# Patient Record
Sex: Male | Born: 1955 | Race: Black or African American | Hispanic: No | Marital: Single | Smoking: Current every day smoker
Health system: Southern US, Community
[De-identification: ages and names within clinical notes are randomized; demographics above are authoritative.]

## PROBLEM LIST (undated history)

## (undated) ENCOUNTER — Emergency Department (HOSPITAL_COMMUNITY): Payer: 59 | Source: Home / Self Care

## (undated) DIAGNOSIS — J45909 Unspecified asthma, uncomplicated: Secondary | ICD-10-CM

## (undated) DIAGNOSIS — M549 Dorsalgia, unspecified: Secondary | ICD-10-CM

## (undated) DIAGNOSIS — E119 Type 2 diabetes mellitus without complications: Secondary | ICD-10-CM

## (undated) DIAGNOSIS — I10 Essential (primary) hypertension: Secondary | ICD-10-CM

## (undated) HISTORY — PX: CYST EXCISION: SHX5701

## (undated) HISTORY — PX: NO PAST SURGERIES: SHX2092

---

## 1998-02-24 ENCOUNTER — Emergency Department (HOSPITAL_COMMUNITY): Admission: EM | Admit: 1998-02-24 | Discharge: 1998-02-24 | Payer: Self-pay | Admitting: Emergency Medicine

## 2008-02-28 ENCOUNTER — Emergency Department (HOSPITAL_COMMUNITY): Admission: EM | Admit: 2008-02-28 | Discharge: 2008-02-29 | Payer: Self-pay | Admitting: Emergency Medicine

## 2012-11-20 ENCOUNTER — Encounter (HOSPITAL_COMMUNITY): Payer: Self-pay | Admitting: Emergency Medicine

## 2012-11-20 ENCOUNTER — Emergency Department (HOSPITAL_COMMUNITY)
Admission: EM | Admit: 2012-11-20 | Discharge: 2012-11-21 | Disposition: A | Discharge status: Home / Self Care | Payer: 59 | Attending: Emergency Medicine | Admitting: Emergency Medicine

## 2012-11-20 ENCOUNTER — Emergency Department (HOSPITAL_COMMUNITY): Payer: 59

## 2012-11-20 DIAGNOSIS — R05 Cough: Secondary | ICD-10-CM | POA: Insufficient documentation

## 2012-11-20 DIAGNOSIS — J111 Influenza due to unidentified influenza virus with other respiratory manifestations: Secondary | ICD-10-CM | POA: Insufficient documentation

## 2012-11-20 DIAGNOSIS — J3489 Other specified disorders of nose and nasal sinuses: Secondary | ICD-10-CM | POA: Insufficient documentation

## 2012-11-20 DIAGNOSIS — R509 Fever, unspecified: Secondary | ICD-10-CM | POA: Insufficient documentation

## 2012-11-20 DIAGNOSIS — R5381 Other malaise: Secondary | ICD-10-CM | POA: Insufficient documentation

## 2012-11-20 DIAGNOSIS — R630 Anorexia: Secondary | ICD-10-CM | POA: Insufficient documentation

## 2012-11-20 DIAGNOSIS — F17200 Nicotine dependence, unspecified, uncomplicated: Secondary | ICD-10-CM | POA: Insufficient documentation

## 2012-11-20 DIAGNOSIS — R059 Cough, unspecified: Secondary | ICD-10-CM | POA: Insufficient documentation

## 2012-11-20 DIAGNOSIS — J029 Acute pharyngitis, unspecified: Secondary | ICD-10-CM | POA: Insufficient documentation

## 2012-11-20 MED ORDER — ACETAMINOPHEN 325 MG PO TABS
650.0000 mg | ORAL_TABLET | Freq: Once | ORAL | Status: AC
  Administered 2012-11-20: 650 mg via ORAL
  Filled 2012-11-20: qty 2

## 2012-11-20 NOTE — ED Notes (Signed)
Pt c/o burning with inspiration, cough, nonproductive x 3 days. +body aches, diarrhea, sore throat, headache, nasal congestion

## 2012-11-21 LAB — INFLUENZA PANEL BY PCR (TYPE A & B)
H1N1 flu by pcr: DETECTED — AB
Influenza B By PCR: NEGATIVE

## 2012-11-21 MED ORDER — HYDROCOD POLST-CHLORPHEN POLST 10-8 MG/5ML PO LQCR: 5.0000 mL | Freq: Two times a day (BID) | ORAL | Status: DC | PRN

## 2012-11-21 MED ORDER — ALBUTEROL SULFATE HFA 108 (90 BASE) MCG/ACT IN AERS: 1.0000 | INHALATION_SPRAY | Freq: Four times a day (QID) | RESPIRATORY_TRACT | Status: DC | PRN

## 2012-11-21 NOTE — ED Provider Notes (Signed)
History     CSN: [id number]  Arrival date & time 11/20/12  2048   First MD Initiated Contact with Patient 11/20/12 2356      Chief Complaint  Patient presents with  . Shortness of Breath    (Consider location/radiation/quality/duration/timing/severity/associated sxs/prior treatment) HPI Comments: Pt presents to the ED with complaints of flu-like symptoms of cough, congestion, sore throat, muscle aches, chills, fever,  headache, decreased appetite, and fatigue. The patient states that the symptoms started three days ago.  Pt has been around other sick contacts and did not get the flu shot this year. The patient denies  neck pain/stiffness, weakness, vision changes, severe abdominal pain, vomiting, diarrhea, inability to eat or drink, difficulty breathing, SOB, wheezing, orchest pain. The patient has taken Nyquil for his symptoms with mild relief.   He is otherwise healthy.  Not immunocompromised.    The history is provided by the patient.    History reviewed. No pertinent past medical history.  Past Surgical History  Procedure Date  . Cyst excision     No family history on file.  History  Substance Use Topics  . Smoking status: Current Every Day Smoker  . Smokeless tobacco: Not on file  . Alcohol Use: Yes     Comment: occasional      Review of Systems  Constitutional: Positive for fever, chills, appetite change and fatigue.  HENT: Positive for congestion, sore throat and rhinorrhea. Negative for drooling, trouble swallowing, neck pain, neck stiffness and sinus pressure.   Eyes: Negative for visual disturbance.  Respiratory: Positive for cough. Negative for shortness of breath.   Cardiovascular: Negative for chest pain.  Gastrointestinal: Negative for nausea, vomiting, abdominal pain and diarrhea.  Skin: Negative for rash.  Neurological: Positive for headaches. Negative for dizziness, syncope and light-headedness.  Psychiatric/Behavioral: Negative for confusion.     Allergies  Review of patient's allergies indicates no known allergies.  Home Medications   Current Outpatient Rx  Name  Route  Sig  Dispense  Refill  . OXYCODONE HCL ER 20 MG PO TB12   Oral   Take 20 mg by mouth 3 (three) times daily.         Marland Kitchen PSEUDOEPH-DOXYLAMINE-DM-APAP 60-7.04-23-999 MG/30ML PO LIQD   Oral   Take 20 mLs by mouth every 6 (six) hours as needed. Cold symptons           BP 127/79  Pulse 101  Temp 99.8 F (37.7 C) (Oral)  Resp 22  Ht 5\' 11"  (1.803 m)  Wt 220 lb (99.791 kg)  BMI 30.68 kg/m2  SpO2 96%  Physical Exam  Nursing note and vitals reviewed. Constitutional: He appears well-developed and well-nourished. No distress.  HENT:  Head: Normocephalic and atraumatic.  Right Ear: Tympanic membrane and ear canal normal.  Left Ear: Tympanic membrane and ear canal normal.  Nose: Rhinorrhea present. Right sinus exhibits no maxillary sinus tenderness and no frontal sinus tenderness. Left sinus exhibits no maxillary sinus tenderness and no frontal sinus tenderness.  Mouth/Throat: Oropharynx is clear and moist.  Neck: Normal range of motion. Neck supple.  Cardiovascular: Normal rate, regular rhythm and normal heart sounds.   Pulmonary/Chest: Effort normal and breath sounds normal. No respiratory distress. He has no wheezes. He has no rales. He exhibits no tenderness.  Abdominal: Soft. There is no tenderness.  Musculoskeletal: Normal range of motion.  Neurological: He is alert.  Skin: Skin is warm and dry. No rash noted. He is not diaphoretic.  Psychiatric: He has a  normal mood and affect.    ED Course  Procedures (including critical care time)  Labs Reviewed - No data to display Dg Chest 2 View  11/20/2012  *RADIOLOGY REPORT*  Clinical Data: Shortness of breath.  Cough and fever  CHEST - 2 VIEW  Comparison: 07/16/2007  Findings: Normal heart size.  No pleural effusion or edema. Atelectasis is noted within the lung bases bilaterally.  No focal areas  of airspace consolidation.  The visualized osseous structures are unremarkable.  IMPRESSION:  1.  Linear atelectasis noted within both lung bases.   Original Report Authenticated By: Signa Kell, M.D.      1. Influenza       MDM  Patient with symptoms consistent with influenza.  Vitals are stable.  Fever responded to Tylenol.    No signs of dehydration, tolerating PO's.  Lungs are clear. Negative CXR.   Discussed the cost versus benefit of Tamiflu treatment with the patient.  The patient understands that symptoms are greater than the recommended 24-48 hour window of treatment.  Patient will be discharged with instructions to orally hydrate, rest, and use over-the-counter medications such as anti-inflammatories ibuprofen and Aleve for muscle aches and Tylenol for fever.  Patient will also be given a cough suppressant.         Pascal Lux Festus, PA-C 11/22/12 2008

## 2012-11-22 NOTE — ED Provider Notes (Signed)
Medical screening examination/treatment/procedure(s) were performed by non-physician practitioner and as supervising physician I was immediately available for consultation/collaboration.  Kadance Mccuistion, MD 11/22/12 2304 

## 2014-05-17 ENCOUNTER — Other Ambulatory Visit: Payer: Self-pay | Admitting: Family

## 2014-05-17 ENCOUNTER — Ambulatory Visit
Admission: RE | Admit: 2014-05-17 | Discharge: 2014-05-17 | Disposition: A | Discharge status: Home / Self Care | Payer: Worker's Compensation | Source: Ambulatory Visit | Attending: Family | Admitting: Family

## 2014-05-17 DIAGNOSIS — R0602 Shortness of breath: Secondary | ICD-10-CM

## 2014-05-17 DIAGNOSIS — R0789 Other chest pain: Secondary | ICD-10-CM

## 2014-05-17 DIAGNOSIS — F17200 Nicotine dependence, unspecified, uncomplicated: Secondary | ICD-10-CM

## 2015-01-20 ENCOUNTER — Encounter (HOSPITAL_COMMUNITY): Payer: Self-pay

## 2015-01-20 ENCOUNTER — Emergency Department (HOSPITAL_COMMUNITY)
Admission: EM | Admit: 2015-01-20 | Discharge: 2015-01-20 | Disposition: A | Discharge status: Home / Self Care | Payer: 59 | Source: Home / Self Care | Attending: Emergency Medicine | Admitting: Emergency Medicine

## 2015-01-20 DIAGNOSIS — R109 Unspecified abdominal pain: Secondary | ICD-10-CM

## 2015-01-20 HISTORY — DX: Dorsalgia, unspecified: M54.9

## 2015-01-20 LAB — POCT URINALYSIS DIP (DEVICE)
Bilirubin Urine: NEGATIVE
Glucose, UA: NEGATIVE mg/dL
Hgb urine dipstick: NEGATIVE
KETONES UR: NEGATIVE mg/dL
LEUKOCYTES UA: NEGATIVE
Nitrite: NEGATIVE
PH: 6 (ref 5.0–8.0)
PROTEIN: NEGATIVE mg/dL
UROBILINOGEN UA: 0.2 mg/dL (ref 0.0–1.0)

## 2015-01-20 MED ORDER — KETOROLAC TROMETHAMINE 60 MG/2ML IM SOLN
60.0000 mg | Freq: Once | INTRAMUSCULAR | Status: AC
  Administered 2015-01-20: 60 mg via INTRAMUSCULAR

## 2015-01-20 MED ORDER — KETOROLAC TROMETHAMINE 60 MG/2ML IM SOLN
INTRAMUSCULAR | Status: AC
  Filled 2015-01-20: qty 2

## 2015-01-20 MED ORDER — IBUPROFEN 800 MG PO TABS: 800.0000 mg | ORAL_TABLET | Freq: Three times a day (TID) | ORAL | Status: DC | PRN

## 2015-01-20 MED ORDER — TAMSULOSIN HCL 0.4 MG PO CAPS: 0.4000 mg | ORAL_CAPSULE | Freq: Every day | ORAL | Status: DC

## 2015-01-20 MED ORDER — CYCLOBENZAPRINE HCL 10 MG PO TABS: 10.0000 mg | ORAL_TABLET | Freq: Three times a day (TID) | ORAL | Status: DC | PRN

## 2015-01-20 NOTE — ED Notes (Signed)
C/o pain in right flank since yesterday, worse today, denies injury . Using oxycontin for pain ( from back issues) . Denies blood in urine . NAD

## 2015-01-20 NOTE — ED Provider Notes (Addendum)
CSN: [id number]     Arrival date & time 01/20/15  1020 History   First MD Initiated Contact with Patient 01/20/15 1106     Chief Complaint  Patient presents with  . Back Pain   (Consider location/radiation/quality/duration/timing/severity/associated sxs/prior Treatment) HPI He is a 59 year old man here for evaluation of right flank pain. He states this started last night. It comes and goes. He denies any triggering events. He states when it is there it is sharp and it is difficult for him to sit still. The pain goes from the mid back to the side. No radiating pain. He denies any gross hematuria. No fevers or chills. No injuries or new activities.  He takes OxyContin for chronic back pain, this has not been helping with the flank pain.  He gets this medication through Dr. Vear Clock at pain management, where he has been seen for the last 6-7 years.  Past Medical History  Diagnosis Date  . Back pain    Past Surgical History  Procedure Laterality Date  . Cyst excision     History reviewed. No pertinent family history. History  Substance Use Topics  . Smoking status: Current Every Day Smoker  . Smokeless tobacco: Not on file  . Alcohol Use: Yes     Comment: occasional    Review of Systems  Constitutional: Negative for fever and chills.  Gastrointestinal: Negative for nausea and vomiting.  Genitourinary: Positive for flank pain. Negative for dysuria and hematuria.  Musculoskeletal: Positive for back pain.    Allergies  Review of patient's allergies indicates no known allergies.  Home Medications   Prior to Admission medications   Medication Sig Start Date End Date Taking? Authorizing Provider  oxyCODONE (OXYCONTIN) 20 MG 12 hr tablet Take 20 mg by mouth 3 (three) times daily.   Yes Historical Provider, MD  albuterol (PROVENTIL HFA;VENTOLIN HFA) 108 (90 BASE) MCG/ACT inhaler Inhale 1-2 puffs into the lungs every 6 (six) hours as needed for wheezing. 11/21/12   Santiago Glad, PA-C   chlorpheniramine-HYDROcodone (TUSSIONEX PENNKINETIC ER) 10-8 MG/5ML LQCR Take 5 mLs by mouth every 12 (twelve) hours as needed. 11/21/12   Heather Laisure, PA-C  cyclobenzaprine (FLEXERIL) 10 MG tablet Take 1 tablet (10 mg total) by mouth 3 (three) times daily as needed for muscle spasms. 01/20/15   Charm Rings, MD  ibuprofen (ADVIL,MOTRIN) 800 MG tablet Take 1 tablet (800 mg total) by mouth every 8 (eight) hours as needed for moderate pain. 01/20/15   Charm Rings, MD  Pseudoeph-Doxylamine-DM-APAP (NYQUIL) 60-7.04-23-999 MG/30ML LIQD Take 20 mLs by mouth every 6 (six) hours as needed. Cold symptons    Historical Provider, MD  tamsulosin (FLOMAX) 0.4 MG CAPS capsule Take 1 capsule (0.4 mg total) by mouth daily. 01/20/15   Charm Rings, MD   BP 129/73 mmHg  Pulse 62  Temp(Src) 96.9 F (36.1 C) (Oral)  Resp 18  SpO2 98% Physical Exam  Constitutional: He is oriented to person, place, and time. He appears well-developed and well-nourished. He appears distressed (shifting around a lot).  Neck: Neck supple.  Cardiovascular: Normal rate.   Pulmonary/Chest: Effort normal.  Musculoskeletal:  Back: No erythema or edema. He does have some mild right thoracic paraspinous spasm. Positive right CVA tenderness. No point tenderness.  Neurological: He is alert and oriented to person, place, and time.    ED Course  Procedures (including critical care time) Labs Review Labs Reviewed  POCT URINALYSIS DIP (DEVICE)    Imaging Review No results found.  MDM   1. Right flank pain    Toradol 60 mg IM given.  History and exam are concerning for kidney stone, however his UA is completely normal. He does also have some muscle spasm in the area of pain. He will continue his OxyContin as per his pain management doctor. I've added ibuprofen 800 mg 3 times a day. Also Flexeril 3 times a day as needed. I also prescribed Flomax for the next 2 weeks just in case there is a stone. Reviewed warning signs to  go to the emergency room.    Charm RingsErin J Serinity Ware, MD 01/20/15 [id number]  Charm RingsErin J Terie Lear, MD 01/20/15 1212

## 2015-01-20 NOTE — Discharge Instructions (Signed)
Your pain may be related to muscle spasm or possibly a kidney stone. Continue your OxyContin as prescribed by your pain management doctor. Take ibuprofen 800 mg 3 times a day as needed. Use the Flomax daily for 2 weeks. If there is a stone, this will help it pass. Use Flexeril 3 times a day as needed for muscle spasm. If your symptoms change or worsen, please go to the emergency room.

## 2018-01-21 ENCOUNTER — Encounter (HOSPITAL_COMMUNITY): Payer: Self-pay | Admitting: Emergency Medicine

## 2018-01-21 ENCOUNTER — Other Ambulatory Visit: Payer: Self-pay

## 2018-01-21 ENCOUNTER — Emergency Department (HOSPITAL_COMMUNITY): Payer: 59

## 2018-01-21 ENCOUNTER — Observation Stay (HOSPITAL_COMMUNITY)
Admission: EM | Admit: 2018-01-21 | Discharge: 2018-01-22 | Disposition: A | Discharge status: Home / Self Care | Payer: 59 | Attending: Internal Medicine | Admitting: Internal Medicine

## 2018-01-21 ENCOUNTER — Observation Stay (HOSPITAL_BASED_OUTPATIENT_CLINIC_OR_DEPARTMENT_OTHER): Payer: 59

## 2018-01-21 DIAGNOSIS — Z79899 Other long term (current) drug therapy: Secondary | ICD-10-CM | POA: Diagnosis not present

## 2018-01-21 DIAGNOSIS — M47812 Spondylosis without myelopathy or radiculopathy, cervical region: Secondary | ICD-10-CM | POA: Insufficient documentation

## 2018-01-21 DIAGNOSIS — M6281 Muscle weakness (generalized): Secondary | ICD-10-CM | POA: Insufficient documentation

## 2018-01-21 DIAGNOSIS — M4050 Lordosis, unspecified, site unspecified: Secondary | ICD-10-CM | POA: Diagnosis not present

## 2018-01-21 DIAGNOSIS — W19XXXA Unspecified fall, initial encounter: Secondary | ICD-10-CM

## 2018-01-21 DIAGNOSIS — M549 Dorsalgia, unspecified: Secondary | ICD-10-CM | POA: Diagnosis not present

## 2018-01-21 DIAGNOSIS — J45909 Unspecified asthma, uncomplicated: Secondary | ICD-10-CM

## 2018-01-21 DIAGNOSIS — R55 Syncope and collapse: Secondary | ICD-10-CM | POA: Diagnosis not present

## 2018-01-21 DIAGNOSIS — M4802 Spinal stenosis, cervical region: Secondary | ICD-10-CM | POA: Insufficient documentation

## 2018-01-21 DIAGNOSIS — Z7984 Long term (current) use of oral hypoglycemic drugs: Secondary | ICD-10-CM | POA: Insufficient documentation

## 2018-01-21 DIAGNOSIS — F17210 Nicotine dependence, cigarettes, uncomplicated: Secondary | ICD-10-CM | POA: Insufficient documentation

## 2018-01-21 DIAGNOSIS — I10 Essential (primary) hypertension: Secondary | ICD-10-CM | POA: Insufficient documentation

## 2018-01-21 DIAGNOSIS — G8929 Other chronic pain: Secondary | ICD-10-CM

## 2018-01-21 DIAGNOSIS — E119 Type 2 diabetes mellitus without complications: Secondary | ICD-10-CM

## 2018-01-21 HISTORY — DX: Essential (primary) hypertension: I10

## 2018-01-21 HISTORY — DX: Type 2 diabetes mellitus without complications: E11.9

## 2018-01-21 HISTORY — DX: Unspecified asthma, uncomplicated: J45.909

## 2018-01-21 LAB — ECHOCARDIOGRAM COMPLETE

## 2018-01-21 LAB — COMPREHENSIVE METABOLIC PANEL
ALT: 49 U/L (ref 17–63)
ANION GAP: 11 (ref 5–15)
AST: 42 U/L — ABNORMAL HIGH (ref 15–41)
Albumin: 3.8 g/dL (ref 3.5–5.0)
Alkaline Phosphatase: 45 U/L (ref 38–126)
BILIRUBIN TOTAL: 0.7 mg/dL (ref 0.3–1.2)
BUN: 7 mg/dL (ref 6–20)
CALCIUM: 9.1 mg/dL (ref 8.9–10.3)
CO2: 21 mmol/L — ABNORMAL LOW (ref 22–32)
Chloride: 107 mmol/L (ref 101–111)
Creatinine, Ser: 0.95 mg/dL (ref 0.61–1.24)
GFR calc Af Amer: >60 mL/min (ref >60)
Glucose, Bld: 119 mg/dL — ABNORMAL HIGH (ref 65–99)
POTASSIUM: 4.2 mmol/L (ref 3.5–5.1)
Sodium: 139 mmol/L (ref 135–145)
TOTAL PROTEIN: 6.6 g/dL (ref 6.5–8.1)

## 2018-01-21 LAB — HEMOGLOBIN A1C
Hgb A1c MFr Bld: 6.5 % — ABNORMAL HIGH (ref 4.8–5.6)
MEAN PLASMA GLUCOSE: 139.85 mg/dL

## 2018-01-21 LAB — CBC WITH DIFFERENTIAL/PLATELET
BASOS ABS: 0 10*3/uL (ref 0.0–0.1)
BASOS PCT: 0 %
EOS ABS: 0 10*3/uL (ref 0.0–0.7)
Eosinophils Relative: 0 %
HCT: 42.5 % (ref 39.0–52.0)
HEMOGLOBIN: 14 g/dL (ref 13.0–17.0)
Lymphocytes Relative: 23 %
Lymphs Abs: 1.7 10*3/uL (ref 0.7–4.0)
MCH: 30.3 pg (ref 26.0–34.0)
MCHC: 32.9 g/dL (ref 30.0–36.0)
MCV: 92 fL (ref 78.0–100.0)
MONO ABS: 0.4 10*3/uL (ref 0.1–1.0)
Monocytes Relative: 6 %
NEUTROS ABS: 5 10*3/uL (ref 1.7–7.7)
NEUTROS PCT: 71 %
PLATELETS: 263 10*3/uL (ref 150–400)
RBC: 4.62 MIL/uL (ref 4.22–5.81)
RDW: 13.9 % (ref 11.5–15.5)
WBC: 7.1 10*3/uL (ref 4.0–10.5)

## 2018-01-21 LAB — CBG MONITORING, ED: Glucose-Capillary: 89 mg/dL (ref 65–99)

## 2018-01-21 LAB — TROPONIN I: Troponin I: <0.03 ng/mL (ref <0.03)

## 2018-01-21 LAB — TSH: TSH: 0.751 u[IU]/mL (ref 0.350–4.500)

## 2018-01-21 LAB — GLUCOSE, CAPILLARY: GLUCOSE-CAPILLARY: 123 mg/dL — AB (ref 65–99)

## 2018-01-21 MED ORDER — OXYCODONE HCL 5 MG PO TABS
5.0000 mg | ORAL_TABLET | Freq: Four times a day (QID) | ORAL | Status: DC | PRN
  Administered 2018-01-21: 10 mg via ORAL
  Filled 2018-01-21: qty 2

## 2018-01-21 MED ORDER — ACETAMINOPHEN 325 MG PO TABS: 650.0000 mg | ORAL_TABLET | Freq: Four times a day (QID) | ORAL | Status: DC | PRN

## 2018-01-21 MED ORDER — POLYETHYLENE GLYCOL 3350 17 G PO PACK: 17.0000 g | PACK | Freq: Every day | ORAL | Status: DC | PRN

## 2018-01-21 MED ORDER — ALBUTEROL SULFATE (2.5 MG/3ML) 0.083% IN NEBU: 2.5000 mg | INHALATION_SOLUTION | Freq: Four times a day (QID) | RESPIRATORY_TRACT | Status: DC | PRN

## 2018-01-21 MED ORDER — ENOXAPARIN SODIUM 40 MG/0.4ML ~~LOC~~ SOLN
40.0000 mg | SUBCUTANEOUS | Status: DC
  Administered 2018-01-21: 40 mg via SUBCUTANEOUS
  Filled 2018-01-21: qty 0.4

## 2018-01-21 MED ORDER — INSULIN ASPART 100 UNIT/ML ~~LOC~~ SOLN: 0.0000 [IU] | Freq: Three times a day (TID) | SUBCUTANEOUS | Status: DC

## 2018-01-21 MED ORDER — SODIUM CHLORIDE 0.9 % IV SOLN
INTRAVENOUS | Status: DC
  Administered 2018-01-21 – 2018-01-22 (×3): via INTRAVENOUS

## 2018-01-21 MED ORDER — ACETAMINOPHEN 650 MG RE SUPP: 650.0000 mg | Freq: Four times a day (QID) | RECTAL | Status: DC | PRN

## 2018-01-21 MED ORDER — MORPHINE SULFATE (PF) 4 MG/ML IV SOLN
8.0000 mg | Freq: Once | INTRAVENOUS | Status: AC
  Administered 2018-01-21: 8 mg via INTRAVENOUS
  Filled 2018-01-21: qty 2

## 2018-01-21 MED ORDER — ADULT MULTIVITAMIN W/MINERALS CH
1.0000 | ORAL_TABLET | Freq: Every day | ORAL | Status: DC
  Administered 2018-01-21 – 2018-01-22 (×2): 1 via ORAL
  Filled 2018-01-21 (×2): qty 1

## 2018-01-21 MED ORDER — MORPHINE SULFATE (PF) 4 MG/ML IV SOLN
1.0000 mg | INTRAVENOUS | Status: DC | PRN
  Administered 2018-01-21 – 2018-01-22 (×2): 1 mg via INTRAVENOUS
  Filled 2018-01-21 (×2): qty 1

## 2018-01-21 MED ORDER — TRAMADOL HCL 50 MG PO TABS: 50.0000 mg | ORAL_TABLET | Freq: Four times a day (QID) | ORAL | Status: DC | PRN

## 2018-01-21 MED ORDER — MORPHINE SULFATE ER 15 MG PO TBCR
15.0000 mg | EXTENDED_RELEASE_TABLET | Freq: Two times a day (BID) | ORAL | Status: DC
  Administered 2018-01-21 – 2018-01-22 (×2): 15 mg via ORAL
  Filled 2018-01-21 (×2): qty 1

## 2018-01-21 NOTE — ED Notes (Signed)
Dinner tray ordered.

## 2018-01-21 NOTE — ED Triage Notes (Addendum)
Pt arrives from home via [facility] reporting dizziness and LOC with fall, lac noted to R eyelid with swelling and abrasion to R shoulder.  Pt reporting pins and needles sensation in BUE to elbow, grip strength weak bilat, c/o "tightness" around Chest and back since fall. Pt deneis CP, SOB, before fall.  Pt able to move all extremities, pulses intact.  EMS reports giving 50 mcg of fentanyl x 2, 324 ASA.  Pt AOx4, c-collar on and aligned. Dr. Jeraldine LootsLockwood at bedside.

## 2018-01-21 NOTE — Progress Notes (Signed)
  Echocardiogram 2D Echocardiogram has been performed.  Roosvelt MaserLane, Shonita Rinck F 01/21/2018, 4:20 PM

## 2018-01-21 NOTE — H&P (Signed)
History and Physical    Ryan Benson:[id number] DOB: [birth year] DOA: 01/21/2018   PCP: Patient, No Pcp Per   Patient coming from:  Home    Chief Complaint: Syncope   HPI: Ryan Benson is a 62 y.o. 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  The patient stood up to reach for a glass of juice, when he experienced a syncopal episode, hitting his head on the right side, without any prodromal symptoms.  He denies any history of seizures.  He denies any prior history of syncope, presyncope, or recent falls.  He denies any chest pain, or palpitations.  He denies any prior cardiac history, or having seen a cardiologist.  He denies any shortness of breath or cough.  No recent infections.  He denies any urinary or bowel incontinence.  He denies any unilateral weakness.  He denies any vision changes, or confusion.  He was in significant amount of pain upon arrival.  He denies any tobacco, alcohol, or recreational drug use.  No new herbal supplements.  No recent long distance trips.   ED Course:  BP (!) 145/80   Pulse 65   Temp 98.2 F (36.8 C) (Oral)   Resp 12   SpO2 99%   On presentation, the patient did receive generous pain control, IV fluids as well as treatment to the right eyebrow, the area of laceration after the fall.  No confusion was noted on presentation. Glucose 119 Creatinine 0.95. AST 42, ALT 49.  3 bilirubin 0.7. CBC is normal. CT of the C-spine, CT of the head are negative for acute intracranial abnormalities or fractures. Right shoulder x-ray, is negative for fractures. MRI of the cervical spine is negative as well. EKG sinus rhythm, is negative for acute disease.  Review of Systems:  As per HPI otherwise all other systems reviewed and are  negative  Past Medical History:  Diagnosis Date  . Asthma   . Back pain   . Diabetes mellitus without complication [facility])     Past Surgical History:  Procedure Laterality Date  . CYST EXCISION      Social History Social History   Socioeconomic History  . Marital status: Single    Spouse name: Not on file  . Number of children: Not on file  . Years of education: Not on file  . Highest education level: Not on file  Social Needs  . Financial resource strain: Not on file  . Food insecurity - worry: Not on file  . Food insecurity - inability: Not on file  . Transportation needs - medical: Not on file  . Transportation needs - non-medical: Not on file  Occupational History  . Not on file  Tobacco Use  . Smoking status: Current Every Day Smoker    Packs/day: 0.50  Substance and Sexual Activity  . Alcohol use: Yes    Alcohol/week: 8.4 oz    Types: 14 Glasses of wine per week    Comment: daily  . Drug use: No  . Sexual activity: Not on file  Other Topics Concern  . Not on file  Social History Narrative  . Not on file     No Known Allergies  No family history on file.    Prior to Admission medications   Medication Sig Start  Date End Date Taking? Authorizing Provider  metFORMIN (GLUCOPHAGE) 500 MG tablet Take 500 mg by mouth 2 (two) times daily. 01/08/18  Yes [provider]  morphine (MS CONTIN) 30 MG 12 hr tablet Take 30 mg by mouth 2 (two) times daily. 12/30/17  Yes [provider]  Multiple Vitamin (MULTIVITAMIN WITH MINERALS) TABS tablet Take 1 tablet by mouth daily.   Yes [provider]  Oxycodone HCl 10 MG TABS Take 10 mg by mouth every 6 (six) hours as needed for pain. 01/15/18  Yes [provider]  albuterol (PROVENTIL HFA;VENTOLIN HFA) 108 (90 BASE) MCG/ACT inhaler Inhale 1-2 puffs into the lungs every 6 (six) hours as needed for wheezing. Patient not taking: Reported on 01/21/2018 11/21/12   Santiago Glad, PA-C   chlorpheniramine-HYDROcodone (TUSSIONEX PENNKINETIC ER) 10-8 MG/5ML LQCR Take 5 mLs by mouth every 12 (twelve) hours as needed. Patient not taking: Reported on 01/21/2018 11/21/12   Santiago Glad, PA-C  cyclobenzaprine (FLEXERIL) 10 MG tablet Take 1 tablet (10 mg total) by mouth 3 (three) times daily as needed for muscle spasms. Patient not taking: Reported on 01/21/2018 01/20/15   Charm Rings, MD  ibuprofen (ADVIL,MOTRIN) 800 MG tablet Take 1 tablet (800 mg total) by mouth every 8 (eight) hours as needed for moderate pain. Patient not taking: Reported on 01/21/2018 01/20/15   Charm Rings, MD  tamsulosin (FLOMAX) 0.4 MG CAPS capsule Take 1 capsule (0.4 mg total) by mouth daily. Patient not taking: Reported on 01/21/2018 01/20/15   Charm Rings, MD    Physical Exam:  Vitals:   01/21/18 1115 01/21/18 1145 01/21/18 1200 01/21/18 1215  BP: (!) 148/85 (!) 145/86 (!) 133/92 (!) 145/80  Pulse: 63 63 74 65  Resp:   17 12  Temp:      TempSrc:      SpO2: 100% 99% 99% 99%   Constitutional: NAD, calm, comfortable Eyes: PERRL, R lids with edema and bruising with laceration over the R eyebrow, and conjunctivae normal ENMT: Mucous membranes are moist, without exudate or lesions  Neck: normal, supple, no masses, no thyromegaly Respiratory: clear to auscultation bilaterally, no wheezing, no crackles. Normal respiratory effort  Cardiovascular: Regular rate and rhythm,  murmur, rubs or gallops. No extremity edema. 2+ pedal pulses. No carotid bruits.  Abdomen: Soft, non tender, No hepatosplenomegaly. Bowel sounds positive.  Musculoskeletal: no clubbing / cyanosis. Moves all extremities Skin: no jaundice, No lesion other than above  Neurologic: Sensation intact  Strength equal in all extremities Psychiatric:   Alert and oriented x 3. Normal mood.     Labs on Admission: I have personally reviewed following labs and imaging studies  CBC: Recent Labs  Lab 01/21/18 0854  WBC 7.1  NEUTROABS 5.0   HGB 14.0  HCT 42.5  MCV 92.0  PLT 263    Basic Metabolic Panel: Recent Labs  Lab 01/21/18 0854  NA 139  K 4.2  CL 107  CO2 21*  GLUCOSE 119*  BUN 7  CREATININE 0.95  CALCIUM 9.1    GFR: CrCl cannot be calculated (Unknown ideal weight.).  Liver Function Tests: Recent Labs  Lab 01/21/18 0854  AST 42*  ALT 49  ALKPHOS 45  BILITOT 0.7  PROT 6.6  ALBUMIN 3.8   No results for input(s): LIPASE, AMYLASE in the last 168 hours. No results for input(s): AMMONIA in the last 168 hours.  Coagulation Profile: No results for input(s): INR, PROTIME in the last 168 hours.  Cardiac Enzymes: Recent  Labs  Lab 01/21/18 0854  TROPONINI <0.03    BNP (last 3 results) No results for input(s): PROBNP in the last 8760 hours.  HbA1C: No results for input(s): HGBA1C in the last 72 hours.  CBG: No results for input(s): GLUCAP in the last 168 hours.  Lipid Profile: No results for input(s): CHOL, HDL, LDLCALC, TRIG, CHOLHDL, LDLDIRECT in the last 72 hours.  Thyroid Function Tests: No results for input(s): TSH, T4TOTAL, FREET4, T3FREE, THYROIDAB in the last 72 hours.  Anemia Panel: No results for input(s): VITAMINB12, FOLATE, FERRITIN, TIBC, IRON, RETICCTPCT in the last 72 hours.  Urine analysis:    Component Value Date/Time   LABSPEC >=1.030 01/20/2015 1137   PHURINE 6.0 01/20/2015 1137   GLUCOSEU NEGATIVE 01/20/2015 1137   HGBUR NEGATIVE 01/20/2015 1137   BILIRUBINUR NEGATIVE 01/20/2015 1137   KETONESUR NEGATIVE 01/20/2015 1137   PROTEINUR NEGATIVE 01/20/2015 1137   UROBILINOGEN 0.2 01/20/2015 1137   NITRITE NEGATIVE 01/20/2015 1137   LEUKOCYTESUR NEGATIVE 01/20/2015 1137    Sepsis Labs: @LABRCNTIP (procalcitonin:4,lacticidven:4) )No results found for this or any previous visit (from the past 240 hour(s)).   Radiological Exams on Admission: Dg Chest 1 View  Result Date: 01/21/2018 CLINICAL DATA:  Syncope, fall, initial encounter. EXAM: CHEST 1 VIEW COMPARISON:   05/17/2014. FINDINGS: Trachea is midline. Heart size stable. Lungs are somewhat low in volume but clear. No pleural fluid. Osseous structures appear grossly intact. IMPRESSION: No acute findings. Electronically Signed   By: Leanna Battles M.D.   On: 01/21/2018 08:51   Dg Shoulder Right  Result Date: 01/21/2018 CLINICAL DATA:  Fall this morning with right shoulder pain. Initial encounter. EXAM: RIGHT SHOULDER - 2+ VIEW COMPARISON:  None. FINDINGS: There is no evidence of fracture or dislocation. There is no evidence of arthropathy or other focal bone abnormality. Soft tissues are unremarkable. IMPRESSION: Negative. Electronically Signed   By: Marnee Spring M.D.   On: 01/21/2018 10:17   Ct Head Wo Contrast  Result Date: 01/21/2018 CLINICAL DATA:  Syncope with fall, head/C-spine injury EXAM: CT HEAD WITHOUT CONTRAST CT CERVICAL SPINE WITHOUT CONTRAST TECHNIQUE: Multidetector CT imaging of the head and cervical spine was performed following the standard protocol without intravenous contrast. Multiplanar CT image reconstructions of the cervical spine were also generated. COMPARISON:  None. FINDINGS: CT HEAD FINDINGS Brain: No evidence of acute infarction, hemorrhage, hydrocephalus, extra-axial collection or mass lesion/mass effect. Mild subcortical white matter and periventricular small vessel ischemic changes. Vascular: No hyperdense vessel or unexpected calcification. Skull: Normal. Negative for fracture or focal lesion. Sinuses/Orbits: The visualized paranasal sinuses are essentially clear. The mastoid air cells are unopacified. Other: Soft tissue swelling in the bilateral periorbital regions, right greater than left (series 4/image 17). CT CERVICAL SPINE FINDINGS Alignment: Normal cervical lordosis. Skull base and vertebrae: No acute fracture. No primary bone lesion or focal pathologic process. Soft tissues and spinal canal: No prevertebral fluid or swelling. No visible canal hematoma. Disc levels:  Moderate degenerative changes of the mid cervical spine. Spinal canal is patent. Upper chest: Visualized lung apices are clear. Other: Visualized thyroid is unremarkable. IMPRESSION: Mild soft tissue swelling in the bilateral periorbital regions, right greater than left. No evidence of acute intracranial abnormality. Mild small vessel ischemic changes. No evidence of traumatic injury to the cervical spine. Mild degenerative changes. Electronically Signed   By: Charline Bills M.D.   On: 01/21/2018 09:09   Ct Cervical Spine Wo Contrast  Result Date: 01/21/2018 CLINICAL DATA:  Syncope with fall, head/C-spine injury  EXAM: CT HEAD WITHOUT CONTRAST CT CERVICAL SPINE WITHOUT CONTRAST TECHNIQUE: Multidetector CT imaging of the head and cervical spine was performed following the standard protocol without intravenous contrast. Multiplanar CT image reconstructions of the cervical spine were also generated. COMPARISON:  None. FINDINGS: CT HEAD FINDINGS Brain: No evidence of acute infarction, hemorrhage, hydrocephalus, extra-axial collection or mass lesion/mass effect. Mild subcortical white matter and periventricular small vessel ischemic changes. Vascular: No hyperdense vessel or unexpected calcification. Skull: Normal. Negative for fracture or focal lesion. Sinuses/Orbits: The visualized paranasal sinuses are essentially clear. The mastoid air cells are unopacified. Other: Soft tissue swelling in the bilateral periorbital regions, right greater than left (series 4/image 17). CT CERVICAL SPINE FINDINGS Alignment: Normal cervical lordosis. Skull base and vertebrae: No acute fracture. No primary bone lesion or focal pathologic process. Soft tissues and spinal canal: No prevertebral fluid or swelling. No visible canal hematoma. Disc levels: Moderate degenerative changes of the mid cervical spine. Spinal canal is patent. Upper chest: Visualized lung apices are clear. Other: Visualized thyroid is unremarkable. IMPRESSION:  Mild soft tissue swelling in the bilateral periorbital regions, right greater than left. No evidence of acute intracranial abnormality. Mild small vessel ischemic changes. No evidence of traumatic injury to the cervical spine. Mild degenerative changes. Electronically Signed   By: Charline Bills M.D.   On: 01/21/2018 09:09   Mr Cervical Spine Wo Contrast  Result Date: 01/21/2018 CLINICAL DATA:  Dizziness and loss of consciousness with fall. Pins and needle sensation in the bilateral upper extremity with weak grip strength. EXAM: MRI CERVICAL SPINE WITHOUT CONTRAST TECHNIQUE: Multiplanar, multisequence MR imaging of the cervical spine was performed. No intravenous contrast was administered. COMPARISON:  Cervical spine CT from earlier today FINDINGS: Alignment: Straightening without subluxation. Vertebrae: No evidence of fracture. Heterogeneous marrow also seen in the lumbar spine in 2018. No focal masslike area. No evidence of ligamentous injury. Cord: No cord signal abnormality. Posterior Fossa, vertebral arteries, paraspinal tissues: No visible strain. Disc levels: C2-3: Spondylosis. Asymmetric left facet spurring. Mild left foraminal stenosis. Patent spinal canal. C3-4: Degenerative disc narrowing with posterior disc osteophyte complex. Ligamentum flavum buckling. These changes cause spinal stenosis with complete CSF effacement and cord flattening. No superimposed cord edema is seen. Disc height loss and uncovertebral spurring cause biforaminal impingement. C4-5: Spondylosis. Asymmetric left facet spurring. Patent spinal canal. Mild left foraminal narrowing. C5-6: Spondylosis and disc narrowing with uncovertebral spurring. Patent spinal canal. Mild to moderate foraminal narrowing. C6-7: Spondylosis.  No herniation or impingement C7-T1:Mild spondylosis.  No impingement. IMPRESSION: 1. 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 No visible cord contusion. 2. No occult fracture, major ligament  tear, or strain. 3. C3-4 bilateral foraminal impingement. C5-6 mild to moderate bilateral foraminal narrowing. Electronically Signed   By: Marnee Spring M.D.   On: 01/21/2018 11:03    EKG: Independently reviewed.  Assessment/Plan Principal Problem:   Syncope Active Problems:   Chronic back pain   Asthma   Diabetes (HCC)    Syncope, unknown etiology, suspect med related versus undiagnosed cardiac disease. Labs, EKG unrevealing.  CT of the head, CT of the C-spine, MRI of the C-spine, right shoulder x-ray and checks x-ray are negative for acute findings, no fracture seen.  Neuro exam essentially negative Syncope order set  Fall precautions Observation Tele bed. 2 D echo Carotid Ultrasound IV fluids at 125 cc an hour EKG in am If abnormality is seen in the echo or the carotid ultrasound, or if the patient develops any other cardiac  issues, will consult cardiology. Careful use of narcotics  Chronic back pain, pain due to fall Continue MSIR at half dose (15 mg bid), continue oxycodone 5 mg every 6 hours as needed, the patient can continue morphine 1 mg every 4 hours as needed for very severe pain.  Ultram has been added to the regimen to avoid oversedation due to narcotics Patient [facility]  History of asthma, no acute issues. We will continue to monitor, albuterol nebulizer for wheezing.  Type II Diabetes Current blood sugar level is 119 No results found for: HGBA1C Hgb A1C Hold home oral diabetic medications.  SSI     DVT prophylaxis:  Lovenox  Code Status:    FUll  Family Communication:  Discussed with patient Disposition Plan: Expect patient to be discharged to home after condition improves Consults called:    None  Admission status: Tele Obs    Marlowe KaysSara Tavita Eastham, PA-C [facility]   Amion text  [phone]   01/21/2018, 2:38 PM

## 2018-01-21 NOTE — ED Notes (Signed)
Attempted report x1. 

## 2018-01-21 NOTE — ED Notes (Signed)
Patient transported to MRI. Spinal precautions reviewed.

## 2018-01-21 NOTE — ED Notes (Signed)
echo tech at bedside art this time

## 2018-01-21 NOTE — Progress Notes (Signed)
Pt transported from 5 central to 5 west 15. Wife at the bedside, pt alert and oriented x 4. Identified appropriately, VS stable, denied chest pain and SOB, no signs of acute distress. Pt c/o generalized weakness and sensations of pins and needles in bilateral upper extremity.  Oriented to room and equipment, bed alarm in place, pt able to ambulate with cane and one person assist.  Instructed to call for assistance and verbalized ability of using call bell. Call bell left within reach. Cardiac monitor placed on pt and CCMD notified. Will continue to monitor and treat pt per MD orders.

## 2018-01-21 NOTE — ED Notes (Addendum)
Patient transported to X-ray. Spinal precautions reviewed. 

## 2018-01-21 NOTE — ED Notes (Signed)
Patient transported to X-ray. Spinal precautions reviewed.

## 2018-01-21 NOTE — ED Provider Notes (Signed)
[facility]   CSN: [id number] Arrival date & time: 01/21/18  0802     History   Chief Complaint Chief Complaint  Patient presents with  . Loss of Consciousness  . Facial Injury    HPI Alison Murrayorman E Sou is a 62 y.o. male.  HPI Patient presents after an episode of syncope, now with pain in multiple areas. He was in her usual state of health until the event, which occurred about 90 minutes prior to my evaluation.  Recall sitting in his breakfast table, and then soon after drinking juice felt lightheaded, and when he tried to stand, he then lost consciousness. This was heard by his wife, who came from the other room immediately. Total loss of consciousness time several moments. Upon awakening, the patient had pain in multiple areas including his chest, both shoulders, his neck, his head. He has pain and weakness in his right arm greater than left, described as diffuse discomfort. No lower extremity weakness, no incontinence. No vision changes, no confusion, no disorientation. Since the event the pain is been persistent and all of these areas, sore, severe, with no medication taken for pain relief. Chest tightness is improved, without clear intervention. Past Medical History:  Diagnosis Date  . Asthma   . Back pain   . Diabetes mellitus without complication (HCC)     There are no active problems to display for this patient.   Past Surgical History:  Procedure Laterality Date  . CYST EXCISION         Home Medications    Prior to Admission medications   Medication Sig Start Date End Date Taking? Authorizing Provider  albuterol (PROVENTIL HFA;VENTOLIN HFA) 108 (90 BASE) MCG/ACT inhaler Inhale 1-2 puffs into the lungs every 6 (six) hours as needed for wheezing. 11/21/12   Santiago GladLaisure, Heather, PA-C  chlorpheniramine-HYDROcodone (TUSSIONEX PENNKINETIC ER) 10-8 MG/5ML LQCR Take 5 mLs by mouth every 12 (twelve) hours as  needed. 11/21/12   Santiago GladLaisure, Heather, PA-C  cyclobenzaprine (FLEXERIL) 10 MG tablet Take 1 tablet (10 mg total) by mouth 3 (three) times daily as needed for muscle spasms. 01/20/15   Charm RingsHonig, Erin J, MD  ibuprofen (ADVIL,MOTRIN) 800 MG tablet Take 1 tablet (800 mg total) by mouth every 8 (eight) hours as needed for moderate pain. 01/20/15   Charm RingsHonig, Erin J, MD  oxyCODONE (OXYCONTIN) 20 MG 12 hr tablet Take 20 mg by mouth 3 (three) times daily.    [provider]  Pseudoeph-Doxylamine-DM-APAP (NYQUIL) 60-7.04-23-999 MG/30ML LIQD Take 20 mLs by mouth every 6 (six) hours as needed. Cold symptons    [provider]  tamsulosin (FLOMAX) 0.4 MG CAPS capsule Take 1 capsule (0.4 mg total) by mouth daily. 01/20/15   Charm RingsHonig, Erin J, MD    Family History No family history on file.  Social History Social History   Tobacco Use  . Smoking status: Current Every Day Smoker    Packs/day: 0.50  Substance Use Topics  . Alcohol use: Yes    Alcohol/week: 8.4 oz    Types: 14 Glasses of wine per week    Comment: daily  . Drug use: No     Allergies   Patient has no known allergies.   Review of Systems Review of Systems  Constitutional:       Per HPI, otherwise negative  HENT:       Per HPI, otherwise negative  Respiratory:       Per HPI, otherwise negative  Cardiovascular:  Per HPI, otherwise negative  Gastrointestinal: Negative for vomiting.  Endocrine:       Negative aside from HPI  Genitourinary:       Neg aside from HPI   Musculoskeletal:       Per HPI, otherwise negative  Skin: Positive for wound.  Neurological: Positive for syncope and weakness.     Physical Exam Updated Vital Signs BP (!) 148/85   Pulse 63   Temp 98.2 F (36.8 C) (Oral)   Resp 16   SpO2 100%   Physical Exam  Constitutional: He is oriented to person, place, and time. He appears well-developed. No distress.  HENT:  Head: Normocephalic.  Multiple small abrasions, and right sided periorbital  swelling, with a nonbleeding abrasion on the right eyelid.  Tender to palpation about the right maxilla, no crepitus. No TMJ pain, patient is edentulous.  Eyes: Conjunctivae and EOM are normal.  Cardiovascular: Normal rate and regular rhythm.  Pulmonary/Chest: Effort normal. No stridor. No respiratory distress.  Abdominal: He exhibits no distension.  Musculoskeletal: He exhibits no edema.       Right shoulder: He exhibits decreased range of motion, tenderness, bony tenderness and laceration.       Left shoulder: Normal.       Right elbow: Normal.      Left elbow: Normal.       Right wrist: Normal.       Left wrist: He exhibits tenderness and bony tenderness. He exhibits no swelling, no effusion, no crepitus, no deformity and no laceration.  Neurological: He is alert and oriented to person, place, and time.  She describes pain and weakness in both arms with motion, though he can elevate each shoulder off the bed, and differentiating weakness secondary to pain or disability is difficult. Distally, both upper extremities are unremarkable, and lower extremity exam is unremarkable, neurologically.   Skin: Skin is warm and dry.  Multiple small abrasions, right shoulder, right arm, face as above.  Psychiatric: He has a normal mood and affect.  Nursing note and vitals reviewed.    ED Treatments / Results  Labs (all labs ordered are listed, but only abnormal results are displayed) Labs Reviewed  COMPREHENSIVE METABOLIC PANEL - Abnormal; Notable for the following components:      Result Value   CO2 21 (*)    Glucose, Bld 119 (*)    AST 42 (*)    All other components within normal limits  TROPONIN I  CBC WITH DIFFERENTIAL/PLATELET  CBG MONITORING, ED    EKG  EKG Interpretation  Date/Time:  Wednesday January 21 2018 08:56:57 EST Ventricular Rate:  60 PR Interval:    QRS Duration: 81 QT Interval:  430 QTC Calculation: 430 R Axis:   44 Text Interpretation:  Sinus rhythm Consider  left ventricular hypertrophy T wave abnormality Abnormal ekg Confirmed by Gerhard Munch [phone]) on 01/21/2018 9:11:58 AM       Radiology Dg Chest 1 View  Result Date: 01/21/2018 CLINICAL DATA:  Syncope, fall, initial encounter. EXAM: CHEST 1 VIEW COMPARISON:  05/17/2014. FINDINGS: Trachea is midline. Heart size stable. Lungs are somewhat low in volume but clear. No pleural fluid. Osseous structures appear grossly intact. IMPRESSION: No acute findings. Electronically Signed   By: Leanna Battles M.D.   On: 01/21/2018 08:51   Dg Shoulder Right  Result Date: 01/21/2018 CLINICAL DATA:  Fall this morning with right shoulder pain. Initial encounter. EXAM: RIGHT SHOULDER - 2+ VIEW COMPARISON:  None. FINDINGS: There is no evidence  of fracture or dislocation. There is no evidence of arthropathy or other focal bone abnormality. Soft tissues are unremarkable. IMPRESSION: Negative. Electronically Signed   By: Marnee Spring M.D.   On: 01/21/2018 10:17   Ct Head Wo Contrast  Result Date: 01/21/2018 CLINICAL DATA:  Syncope with fall, head/C-spine injury EXAM: CT HEAD WITHOUT CONTRAST CT CERVICAL SPINE WITHOUT CONTRAST TECHNIQUE: Multidetector CT imaging of the head and cervical spine was performed following the standard protocol without intravenous contrast. Multiplanar CT image reconstructions of the cervical spine were also generated. COMPARISON:  None. FINDINGS: CT HEAD FINDINGS Brain: No evidence of acute infarction, hemorrhage, hydrocephalus, extra-axial collection or mass lesion/mass effect. Mild subcortical white matter and periventricular small vessel ischemic changes. Vascular: No hyperdense vessel or unexpected calcification. Skull: Normal. Negative for fracture or focal lesion. Sinuses/Orbits: The visualized paranasal sinuses are essentially clear. The mastoid air cells are unopacified. Other: Soft tissue swelling in the bilateral periorbital regions, right greater than left (series 4/image 17). CT  CERVICAL SPINE FINDINGS Alignment: Normal cervical lordosis. Skull base and vertebrae: No acute fracture. No primary bone lesion or focal pathologic process. Soft tissues and spinal canal: No prevertebral fluid or swelling. No visible canal hematoma. Disc levels: Moderate degenerative changes of the mid cervical spine. Spinal canal is patent. Upper chest: Visualized lung apices are clear. Other: Visualized thyroid is unremarkable. IMPRESSION: Mild soft tissue swelling in the bilateral periorbital regions, right greater than left. No evidence of acute intracranial abnormality. Mild small vessel ischemic changes. No evidence of traumatic injury to the cervical spine. Mild degenerative changes. Electronically Signed   By: Charline Bills M.D.   On: 01/21/2018 09:09   Ct Cervical Spine Wo Contrast  Result Date: 01/21/2018 CLINICAL DATA:  Syncope with fall, head/C-spine injury EXAM: CT HEAD WITHOUT CONTRAST CT CERVICAL SPINE WITHOUT CONTRAST TECHNIQUE: Multidetector CT imaging of the head and cervical spine was performed following the standard protocol without intravenous contrast. Multiplanar CT image reconstructions of the cervical spine were also generated. COMPARISON:  None. FINDINGS: CT HEAD FINDINGS Brain: No evidence of acute infarction, hemorrhage, hydrocephalus, extra-axial collection or mass lesion/mass effect. Mild subcortical white matter and periventricular small vessel ischemic changes. Vascular: No hyperdense vessel or unexpected calcification. Skull: Normal. Negative for fracture or focal lesion. Sinuses/Orbits: The visualized paranasal sinuses are essentially clear. The mastoid air cells are unopacified. Other: Soft tissue swelling in the bilateral periorbital regions, right greater than left (series 4/image 17). CT CERVICAL SPINE FINDINGS Alignment: Normal cervical lordosis. Skull base and vertebrae: No acute fracture. No primary bone lesion or focal pathologic process. Soft tissues and spinal  canal: No prevertebral fluid or swelling. No visible canal hematoma. Disc levels: Moderate degenerative changes of the mid cervical spine. Spinal canal is patent. Upper chest: Visualized lung apices are clear. Other: Visualized thyroid is unremarkable. IMPRESSION: Mild soft tissue swelling in the bilateral periorbital regions, right greater than left. No evidence of acute intracranial abnormality. Mild small vessel ischemic changes. No evidence of traumatic injury to the cervical spine. Mild degenerative changes. Electronically Signed   By: Charline Bills M.D.   On: 01/21/2018 09:09   Mr Cervical Spine Wo Contrast  Result Date: 01/21/2018 CLINICAL DATA:  Dizziness and loss of consciousness with fall. Pins and needle sensation in the bilateral upper extremity with weak grip strength. EXAM: MRI CERVICAL SPINE WITHOUT CONTRAST TECHNIQUE: Multiplanar, multisequence MR imaging of the cervical spine was performed. No intravenous contrast was administered. COMPARISON:  Cervical spine CT from earlier today FINDINGS: Alignment:  Straightening without subluxation. Vertebrae: No evidence of fracture. Heterogeneous marrow also seen in the lumbar spine in 2018. No focal masslike area. No evidence of ligamentous injury. Cord: No cord signal abnormality. Posterior Fossa, vertebral arteries, paraspinal tissues: No visible strain. Disc levels: C2-3: Spondylosis. Asymmetric left facet spurring. Mild left foraminal stenosis. Patent spinal canal. C3-4: Degenerative disc narrowing with posterior disc osteophyte complex. Ligamentum flavum buckling. These changes cause spinal stenosis with complete CSF effacement and cord flattening. No superimposed cord edema is seen. Disc height loss and uncovertebral spurring cause biforaminal impingement. C4-5: Spondylosis. Asymmetric left facet spurring. Patent spinal canal. Mild left foraminal narrowing. C5-6: Spondylosis and disc narrowing with uncovertebral spurring. Patent spinal canal.  Mild to moderate foraminal narrowing. C6-7: Spondylosis.  No herniation or impingement C7-T1:Mild spondylosis.  No impingement. IMPRESSION: 1. 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 No visible cord contusion. 2. No occult fracture, major ligament tear, or strain. 3. C3-4 bilateral foraminal impingement. C5-6 mild to moderate bilateral foraminal narrowing. Electronically Signed   By: Marnee Spring M.D.   On: 01/21/2018 11:03    Procedures Procedures (including critical care time)  Medications Ordered in ED Medications  0.9 %  sodium chloride infusion (not administered)     Initial Impression / Assessment and Plan / ED Course  I have reviewed the triage vital signs and the nursing notes.  Pertinent labs & imaging results that were available during my care of the patient were reviewed by me and considered in my medical decision making (see chart for details).     Update:, After initial studies, with persistent bilateral arm subjective weakness, and pain, in spite of reassuring CT scan, MRI will be performed to evaluate for soft tissue injury.     11:40 AM Patient awake and alert, continues to have some pain and subjective weakness with moving his arms. We have reviewed all findings including initial x-ray, CT, and MRI results, consistent with degenerative changes, inflammatory changes, abutting the spinal cord, but no new impingement, no new fracture. Patient's post traumatic effects, seem minimal, with possible concussion, no evidence for fracture, intracranial hemorrhage.  However, etiology for his actual syncope event is unclear, and he again denies any history of arrhythmia, palpitations, nor any preceding chest pain. Given the patient's unclear episode, without  provocation, and with syncope, the patient will be admitted for further evaluation and management.   Final Clinical Impressions(s) / ED Diagnoses   Final diagnoses:  Syncope  Warnell Bureau, MD 01/21/18 1355

## 2018-01-22 ENCOUNTER — Other Ambulatory Visit: Payer: Self-pay

## 2018-01-22 DIAGNOSIS — W19XXXD Unspecified fall, subsequent encounter: Secondary | ICD-10-CM

## 2018-01-22 DIAGNOSIS — M549 Dorsalgia, unspecified: Secondary | ICD-10-CM

## 2018-01-22 DIAGNOSIS — G8929 Other chronic pain: Secondary | ICD-10-CM

## 2018-01-22 DIAGNOSIS — R55 Syncope and collapse: Secondary | ICD-10-CM | POA: Diagnosis not present

## 2018-01-22 DIAGNOSIS — M4050 Lordosis, unspecified, site unspecified: Secondary | ICD-10-CM | POA: Diagnosis not present

## 2018-01-22 DIAGNOSIS — M4802 Spinal stenosis, cervical region: Secondary | ICD-10-CM | POA: Diagnosis not present

## 2018-01-22 DIAGNOSIS — M47812 Spondylosis without myelopathy or radiculopathy, cervical region: Secondary | ICD-10-CM | POA: Diagnosis not present

## 2018-01-22 LAB — BASIC METABOLIC PANEL
Anion gap: 8 (ref 5–15)
BUN: 9 mg/dL (ref 6–20)
CALCIUM: 8.5 mg/dL — AB (ref 8.9–10.3)
CHLORIDE: 106 mmol/L (ref 101–111)
CO2: 25 mmol/L (ref 22–32)
Creatinine, Ser: 1.05 mg/dL (ref 0.61–1.24)
GFR calc Af Amer: >60 mL/min (ref >60)
GFR calc non Af Amer: >60 mL/min (ref >60)
GLUCOSE: 115 mg/dL — AB (ref 65–99)
Potassium: 4.1 mmol/L (ref 3.5–5.1)
Sodium: 139 mmol/L (ref 135–145)

## 2018-01-22 LAB — GLUCOSE, CAPILLARY: Glucose-Capillary: 103 mg/dL — ABNORMAL HIGH (ref 65–99)

## 2018-01-22 LAB — LIPID PANEL
Cholesterol: 210 mg/dL — ABNORMAL HIGH (ref 0–200)
HDL: 46 mg/dL (ref >40)
LDL CALC: 116 mg/dL — AB (ref 0–99)
Total CHOL/HDL Ratio: 4.6 RATIO
Triglycerides: 241 mg/dL — ABNORMAL HIGH (ref <150)
VLDL: 48 mg/dL — AB (ref 0–40)

## 2018-01-22 LAB — TROPONIN I

## 2018-01-22 LAB — RAPID URINE DRUG SCREEN, HOSP PERFORMED
AMPHETAMINES: NOT DETECTED
BENZODIAZEPINES: NOT DETECTED
Barbiturates: NOT DETECTED
COCAINE: NOT DETECTED
OPIATES: POSITIVE — AB
TETRAHYDROCANNABINOL: NOT DETECTED

## 2018-01-22 LAB — HIV ANTIBODY (ROUTINE TESTING W REFLEX): HIV Screen 4th Generation wRfx: NONREACTIVE

## 2018-01-22 NOTE — Progress Notes (Signed)
[facility] and assist with AD.  Patient is being d/c. Patient chose to take AD with him and will get it done on return follow up visit.  Venida JarvisWatlington, Shaleka Brines, [place], [facility], Pager [phone]

## 2018-01-22 NOTE — Progress Notes (Signed)
Pt discharged to home. PIV removed, AVS reviewed. Pt ambulated halls with PT, completed ADLs without complaint of lightheadedness/dizziness. Pt to follow up with PCP outpatient. Pt left unit via wheelchair, to be transported home by spouse.

## 2018-01-22 NOTE — Discharge Summary (Signed)
Physician Discharge Summary   Patient ID: Ryan Benson MRN: [id number] DOB/AGE: 62/11/1955 62 y.o.  Admit date: 01/21/2018 Discharge date: 01/22/2018  Primary Care Physician:  Salli RealSun, Yun, MD   Recommendations for Outpatient Follow-up:  1. Follow up with PCP in 1-2 weeks 2. Please obtain BMP/CBC in one week  Home Health: None Equipment/Devices: None  Discharge Condition: stable  CODE STATUS: FULL  Diet recommendation: carb modified diet   Discharge Diagnoses:   Syncope Chronic back pain History of asthma Type 2 diabetes mellitus Right periorbital edema from the fall and laceration of forehead  Consults:  None    Allergies:  No Known Allergies   DISCHARGE MEDICATIONS: Allergies as of 01/22/2018   No Known Allergies     Medication List    STOP taking these medications   albuterol 108 (90 Base) MCG/ACT inhaler Commonly known as:  PROVENTIL HFA;VENTOLIN HFA   chlorpheniramine-HYDROcodone 10-8 MG/5ML Lqcr Commonly known as:  TUSSIONEX PENNKINETIC ER   cyclobenzaprine 10 MG tablet Commonly known as:  FLEXERIL   ibuprofen 800 MG tablet Commonly known as:  ADVIL,MOTRIN   tamsulosin 0.4 MG Caps capsule Commonly known as:  FLOMAX     TAKE these medications   metFORMIN 500 MG tablet Commonly known as:  GLUCOPHAGE Take 500 mg by mouth 2 (two) times daily.   morphine 30 MG 12 hr tablet Commonly known as:  MS CONTIN Take 30 mg by mouth 2 (two) times daily.   multivitamin with minerals Tabs tablet Take 1 tablet by mouth daily.   Oxycodone HCl 10 MG Tabs Take 10 mg by mouth every 6 (six) hours as needed for pain.        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 The patient stood up to reach for a glass of juice,  when he experienced a syncopal episode, hitting his head on the right side, without any prodromal symptoms.  He denies any history of seizures.  He denies any prior history of syncope, presyncope, or recent falls.  He denies any chest pain, or palpitations.     Hospital Course:     Syncope possibly vasovagal - Labs unrevealing, EKG showed sinus bradycardia otherwise no heart blocks or acute ischemia. CT head, CT C-spine negative for any traumatic pathology. - MRI of the C-spine showed C3-4 degenerative spinal stenosis with cord flattening no contusion fracture, major ligament tear or strain. C3-4 bilateral foraminal impingement, C5-6 mild to moderate bilateral foraminal narrowing. - Troponins negative, no arrhythmias on telemetry, no orthostasis - 2-D echo showed EF of 55-60% with grade 1 diastolic dysfunction, no regional wall motion abnormalities. - Cleared by PT evaluation, patient is tolerating without any difficulty with no symptoms of dizziness.     Chronic back pain - NCSRS reviewed, receiving medications from only 1 provider and taking his prescribed. No acute issues patient can continue his medications and follow up with his PCP.    Asthma, stable - Currently stable, no wheezing    Diabetes (HCC) - Continue metformin   Day of Discharge S: Doing well, no dizziness, chest pain or shortness of breath, wants to go home  BP (!) 154/86 (BP Location: Right Arm)   Pulse (!) 17   Temp 98 F (36.7 C) (Oral)  Resp 17   Ht 5\' 9"  (1.753 m)   Wt 90.2 kg (198 lb 13.7 oz)   SpO2 100%   BMI 29.37 kg/m   Physical Exam: General: Alert and awake oriented x3 not in any acute distress. HEENT: anicteric sclera, pupils reactive to light and accommodation, right periorbital edema with laceration on the forehead  CVS: S1-S2 clear no murmur rubs or gallops Chest: clear to auscultation bilaterally, no wheezing rales or rhonchi Abdomen: soft nontender, nondistended, normal bowel  sounds Extremities: no cyanosis, clubbing or edema noted bilaterally Neuro: Cranial nerves II-XII intact, no focal neurological deficits   The results of significant diagnostics from this hospitalization (including imaging, microbiology, ancillary and laboratory) are listed below for reference.      Procedures/Studies: Study Conclusions  - Left ventricle: The cavity size was normal. There was mild   concentric hypertrophy. Systolic function was normal. The   estimated ejection fraction was in the range of 55% to 60%. Wall   motion was normal; there were no regional wall motion   abnormalities. Doppler parameters are consistent with abnormal   left ventricular relaxation (grade 1 diastolic dysfunction). - Aortic valve: Transvalvular velocity was within the normal range.   There was no stenosis. There was no regurgitation. - Mitral valve: Transvalvular velocity was within the normal range.   There was no evidence for stenosis. There was no regurgitation. - Left atrium: The atrium was severely dilated. - Right ventricle: The cavity size was normal. Wall thickness was   normal. Systolic function was normal. - Atrial septum: No defect or patent foramen ovale was identified. - Tricuspid valve: There was no regurgitation.    Dg Chest 1 View  Result Date: 01/21/2018 CLINICAL DATA:  Syncope, fall, initial encounter. EXAM: CHEST 1 VIEW COMPARISON:  05/17/2014. FINDINGS: Trachea is midline. Heart size stable. Lungs are somewhat low in volume but clear. No pleural fluid. Osseous structures appear grossly intact. IMPRESSION: No acute findings. Electronically Signed   By: Leanna Battles M.D.   On: 01/21/2018 08:51   Dg Shoulder Right  Result Date: 01/21/2018 CLINICAL DATA:  Fall this morning with right shoulder pain. Initial encounter. EXAM: RIGHT SHOULDER - 2+ VIEW COMPARISON:  None. FINDINGS: There is no evidence of fracture or dislocation. There is no evidence of arthropathy or other focal  bone abnormality. Soft tissues are unremarkable. IMPRESSION: Negative. Electronically Signed   By: Marnee Spring M.D.   On: 01/21/2018 10:17   Ct Head Wo Contrast  Result Date: 01/21/2018 CLINICAL DATA:  Syncope with fall, head/C-spine injury EXAM: CT HEAD WITHOUT CONTRAST CT CERVICAL SPINE WITHOUT CONTRAST TECHNIQUE: Multidetector CT imaging of the head and cervical spine was performed following the standard protocol without intravenous contrast. Multiplanar CT image reconstructions of the cervical spine were also generated. COMPARISON:  None. FINDINGS: CT HEAD FINDINGS Brain: No evidence of acute infarction, hemorrhage, hydrocephalus, extra-axial collection or mass lesion/mass effect. Mild subcortical white matter and periventricular small vessel ischemic changes. Vascular: No hyperdense vessel or unexpected calcification. Skull: Normal. Negative for fracture or focal lesion. Sinuses/Orbits: The visualized paranasal sinuses are essentially clear. The mastoid air cells are unopacified. Other: Soft tissue swelling in the bilateral periorbital regions, right greater than left (series 4/image 17). CT CERVICAL SPINE FINDINGS Alignment: Normal cervical lordosis. Skull base and vertebrae: No acute fracture. No primary bone lesion or focal pathologic process. Soft tissues and spinal canal: No prevertebral fluid or swelling. No visible canal hematoma. Disc levels: Moderate degenerative changes of the mid cervical spine.  Spinal canal is patent. Upper chest: Visualized lung apices are clear. Other: Visualized thyroid is unremarkable. IMPRESSION: Mild soft tissue swelling in the bilateral periorbital regions, right greater than left. No evidence of acute intracranial abnormality. Mild small vessel ischemic changes. No evidence of traumatic injury to the cervical spine. Mild degenerative changes. Electronically Signed   By: Charline Bills M.D.   On: 01/21/2018 09:09   Ct Cervical Spine Wo Contrast  Result Date:  01/21/2018 CLINICAL DATA:  Syncope with fall, head/C-spine injury EXAM: CT HEAD WITHOUT CONTRAST CT CERVICAL SPINE WITHOUT CONTRAST TECHNIQUE: Multidetector CT imaging of the head and cervical spine was performed following the standard protocol without intravenous contrast. Multiplanar CT image reconstructions of the cervical spine were also generated. COMPARISON:  None. FINDINGS: CT HEAD FINDINGS Brain: No evidence of acute infarction, hemorrhage, hydrocephalus, extra-axial collection or mass lesion/mass effect. Mild subcortical white matter and periventricular small vessel ischemic changes. Vascular: No hyperdense vessel or unexpected calcification. Skull: Normal. Negative for fracture or focal lesion. Sinuses/Orbits: The visualized paranasal sinuses are essentially clear. The mastoid air cells are unopacified. Other: Soft tissue swelling in the bilateral periorbital regions, right greater than left (series 4/image 17). CT CERVICAL SPINE FINDINGS Alignment: Normal cervical lordosis. Skull base and vertebrae: No acute fracture. No primary bone lesion or focal pathologic process. Soft tissues and spinal canal: No prevertebral fluid or swelling. No visible canal hematoma. Disc levels: Moderate degenerative changes of the mid cervical spine. Spinal canal is patent. Upper chest: Visualized lung apices are clear. Other: Visualized thyroid is unremarkable. IMPRESSION: Mild soft tissue swelling in the bilateral periorbital regions, right greater than left. No evidence of acute intracranial abnormality. Mild small vessel ischemic changes. No evidence of traumatic injury to the cervical spine. Mild degenerative changes. Electronically Signed   By: Charline Bills M.D.   On: 01/21/2018 09:09   Mr Cervical Spine Wo Contrast  Result Date: 01/21/2018 CLINICAL DATA:  Dizziness and loss of consciousness with fall. Pins and needle sensation in the bilateral upper extremity with weak grip strength. EXAM: MRI CERVICAL SPINE  WITHOUT CONTRAST TECHNIQUE: Multiplanar, multisequence MR imaging of the cervical spine was performed. No intravenous contrast was administered. COMPARISON:  Cervical spine CT from earlier today FINDINGS: Alignment: Straightening without subluxation. Vertebrae: No evidence of fracture. Heterogeneous marrow also seen in the lumbar spine in 2018. No focal masslike area. No evidence of ligamentous injury. Cord: No cord signal abnormality. Posterior Fossa, vertebral arteries, paraspinal tissues: No visible strain. Disc levels: C2-3: Spondylosis. Asymmetric left facet spurring. Mild left foraminal stenosis. Patent spinal canal. C3-4: Degenerative disc narrowing with posterior disc osteophyte complex. Ligamentum flavum buckling. These changes cause spinal stenosis with complete CSF effacement and cord flattening. No superimposed cord edema is seen. Disc height loss and uncovertebral spurring cause biforaminal impingement. C4-5: Spondylosis. Asymmetric left facet spurring. Patent spinal canal. Mild left foraminal narrowing. C5-6: Spondylosis and disc narrowing with uncovertebral spurring. Patent spinal canal. Mild to moderate foraminal narrowing. C6-7: Spondylosis.  No herniation or impingement C7-T1:Mild spondylosis.  No impingement. IMPRESSION: 1. 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 No visible cord contusion. 2. No occult fracture, major ligament tear, or strain. 3. C3-4 bilateral foraminal impingement. C5-6 mild to moderate bilateral foraminal narrowing. Electronically Signed   By: Marnee Spring M.D.   On: 01/21/2018 11:03       LAB RESULTS: Basic Metabolic Panel: Recent Labs  Lab 01/21/18 0854 01/22/18 0338  NA 139 139  K 4.2 4.1  CL 107 106  CO2 21* 25  GLUCOSE 119* 115*  BUN 7 9  CREATININE 0.95 1.05  CALCIUM 9.1 8.5*   Liver Function Tests: Recent Labs  Lab 01/21/18 0854  AST 42*  ALT 49  ALKPHOS 45  BILITOT 0.7  PROT 6.6  ALBUMIN 3.8   No results for input(s):  LIPASE, AMYLASE in the last 168 hours. No results for input(s): AMMONIA in the last 168 hours. CBC: Recent Labs  Lab 01/21/18 0854  WBC 7.1  NEUTROABS 5.0  HGB 14.0  HCT 42.5  MCV 92.0  PLT 263   Cardiac Enzymes: Recent Labs  Lab 01/21/18 1432 01/22/18 0701  TROPONINI <0.03 <0.03   BNP: Invalid input(s): POCBNP CBG: Recent Labs  Lab 01/21/18 2317 01/22/18 0754  GLUCAP 123* 103*      Disposition and Follow-up: Discharge Instructions    Diet Carb Modified   Complete by:  As directed    Discharge instructions   Complete by:  As directed    Stay hydrated. If the symptoms recur, please discuss with your PCP for further work-up or return to ER.   Increase activity slowly   Complete by:  As directed        DISPOSITION:  Home    DISCHARGE FOLLOW-UP Follow-up Information    Salli Real, MD. Schedule an appointment as soon as possible for a visit in 2 week(s).   Specialty:  Internal Medicine Contact information: [address] [place] [address] [phone]            Time coordinating discharge:    Signed:   Thad Ranger M.D. [facility] 01/22/2018, 10:17 AM Pager: [phone]

## 2018-01-22 NOTE — Evaluation (Signed)
Physical Therapy Evaluation Patient Details Name: Ryan Benson MRN: [id number] DOB: 07-09-1956 Today's Date: 01/22/2018   History of Present Illness  62 y.o. 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 He had erythema and excoriation of his R eye area and an abrasion of his R shoulder.  Clinical Impression  Patient evaluated by Physical Therapy with no further acute PT needs identified. All education has been completed and the patient has no further questions. Pt is mod I with bed mobility and transfers and supervision for ambulation of 150 feet with SPC. Pt does not require any PT follow up. PT is signing off. Thank you for this referral.     Follow Up Recommendations No PT follow up    Equipment Recommendations  None recommended by PT    Recommendations for Other Services       Precautions / Restrictions Precautions Precautions: Fall Restrictions Weight Bearing Restrictions: No      Mobility  Bed Mobility Overal bed mobility: Modified Independent                Transfers Overall transfer level: Modified independent                  Ambulation/Gait Ambulation/Gait assistance: Supervision Ambulation Distance (Feet): 150 Feet Assistive device: Straight cane Gait Pattern/deviations: Step-through pattern;Decreased step length - right;Decreased step length - left Gait velocity: slowed Gait velocity interpretation: Below normal speed for age/gender General Gait Details: decreased step length and decreased cadence,  no LoB overall WFL      Balance Overall balance assessment: No apparent balance deficits (not formally assessed)                                           Pertinent Vitals/Pain Pain Assessment: 0-10 Pain  Score: 8  Pain Location: shoulders and neck  Pain Descriptors / Indicators: Aching;Grimacing;Guarding;Constant Pain Intervention(s): Limited activity within patient's tolerance;Monitored during session;Repositioned    Home Living Family/patient expects to be discharged to:: Private residence Living Arrangements: Spouse/significant other Available Help at Discharge: Available 24 hours/day;Family(at d/c) Type of Home: House Home Access: Stairs to enter   Secretary/administrator of Steps: 1 Home Layout: One level Home Equipment: Cane - single point;Grab bars - tub/shower;Hand held shower head;Tub bench;Crutches      Prior Function Level of Independence: Independent with assistive device(s)         Comments: ambulates community distances with cane, driver     Hand Dominance   Dominant Hand: Left    Extremity/Trunk Assessment   Upper Extremity Assessment Upper Extremity Assessment: RUE deficits/detail;LUE deficits/detail RUE Deficits / Details: decreased shoulder ROM secondary to pain from fall, elbow and hand ROM and strength WFL LUE Deficits / Details: decreased shoulder ROM secondary to pain from fall, elbow and hand ROM and strength WFL         Cervical / Trunk Assessment Cervical / Trunk Assessment: Other exceptions(decreased cervical ROM secondary to pain from fall)  Communication   Communication: No difficulties  Cognition Arousal/Alertness: Awake/alert Behavior During Therapy: WFL for tasks assessed/performed Overall Cognitive Status: Within Functional Limits for tasks assessed  Assessment/Plan    PT Assessment Patent does not need any further PT services         PT Goals (Current goals can be found in the Care Plan section)  Acute Rehab PT Goals Patient Stated Goal: go home PT Goal Formulation: With patient Time For Goal Achievement: 02/05/18     AM-PAC PT "6 Clicks" Daily Activity   Outcome Measure Difficulty turning over in bed (including adjusting bedclothes, sheets and blankets)?: None Difficulty moving from lying on back to sitting on the side of the bed? : A Little Difficulty sitting down on and standing up from a chair with arms (e.g., wheelchair, bedside commode, etc,.)?: A Little Help needed moving to and from a bed to chair (including a wheelchair)?: None Help needed walking in hospital room?: None Help needed climbing 3-5 steps with a railing? : None 6 Click Score: 22    End of Session Equipment Utilized During Treatment: Gait belt Activity Tolerance: Patient tolerated treatment well Patient left: in bed;with call bell/phone within reach;with bed alarm set Nurse Communication: Mobility status PT Visit Diagnosis: Muscle weakness (generalized) (M62.81)    Time: [id number] PT Time Calculation (min) (ACUTE ONLY): 21 min   Charges:   PT Evaluation $PT Eval Low Complexity: 1 Low     PT G Codes:        Tyus Kallam B. Beverely RisenVan Fleet PT, DPT Acute Rehabilitation  [phone] Pager [phone]    Elon Alaslizabeth B Van Fleet 01/22/2018, 10:34 AM

## 2019-02-02 IMAGING — MR MR CERVICAL SPINE W/O CM
4 of 7 series · 19 of 48 positions shown · non-contrast
Comparison: Cervical spine CT from earlier today

CLINICAL DATA: Dizziness and loss of consciousness with fall. Pins
and needle sensation in the bilateral upper extremity with weak grip
strength.

EXAM:
MRI CERVICAL SPINE WITHOUT CONTRAST
TECHNIQUE: Multiplanar, multisequence MR imaging of the cervical spine was
performed. No intravenous contrast was administered.

[Series 2: T2 · sagittal · 3.0mm · 0.47mm/px · 5 of 15 slices shown (1 of 2)]
[im 1/15]
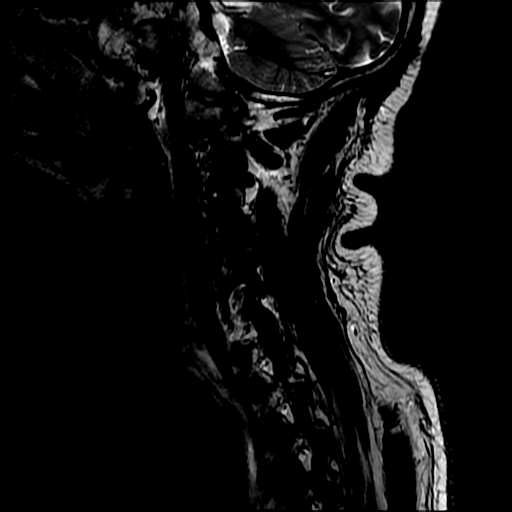
[im 4/15]
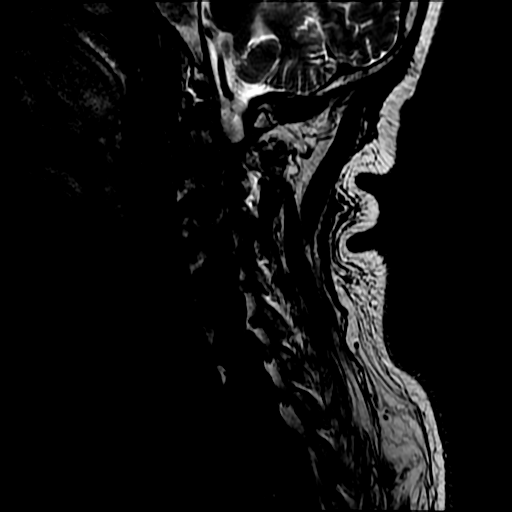
[im 8/15]
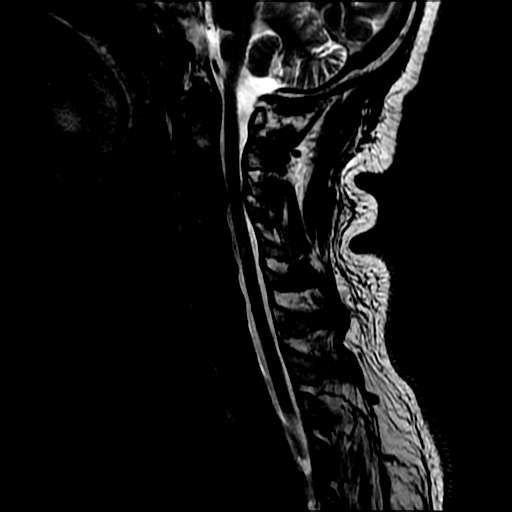
[im 11/15]
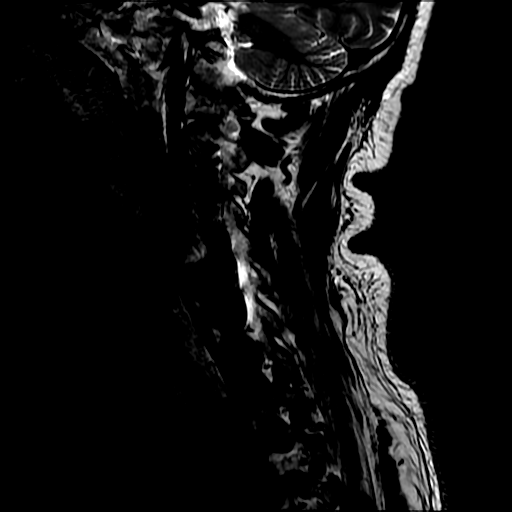
[im 15/15]
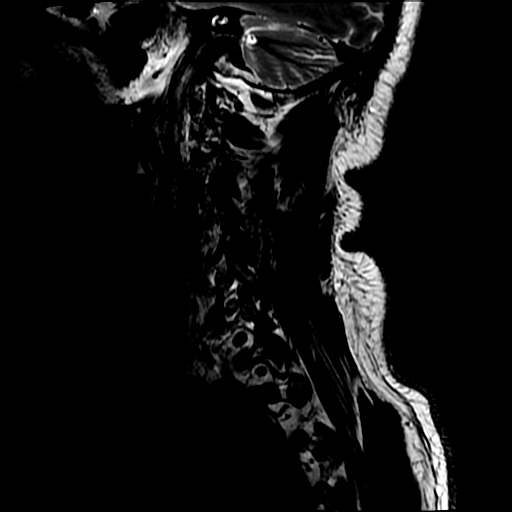

[Series 3: STIR · sagittal · 3.0mm · 0.47mm/px · 3 of 15 slices shown]
[im 1/15]
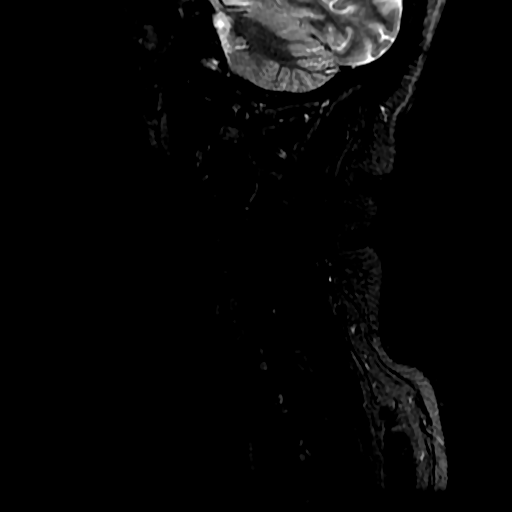
[im 8/15]
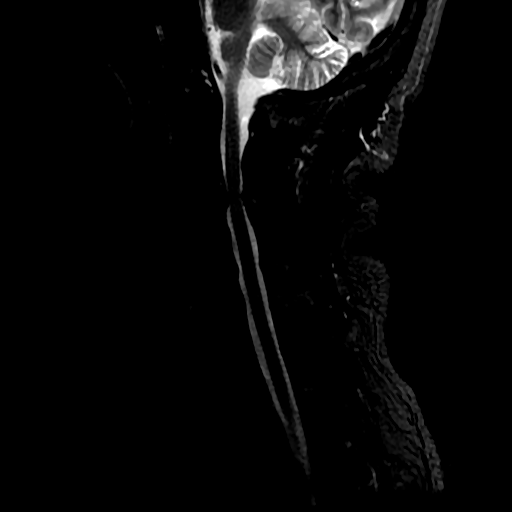
[im 15/15]
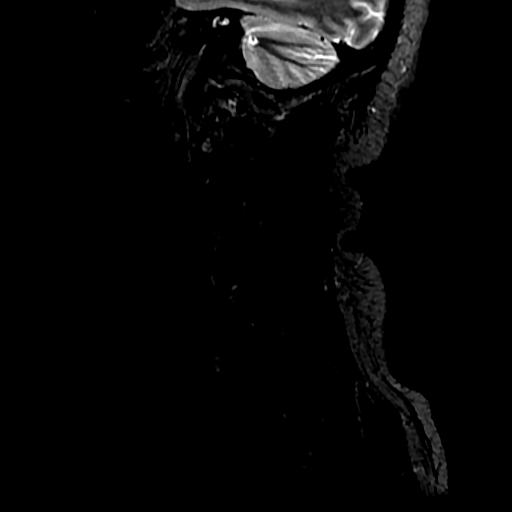

[Series 4: FLAIR · sagittal · 3.0mm · 0.47mm/px · 3 of 15 slices shown]
[im 1/15]
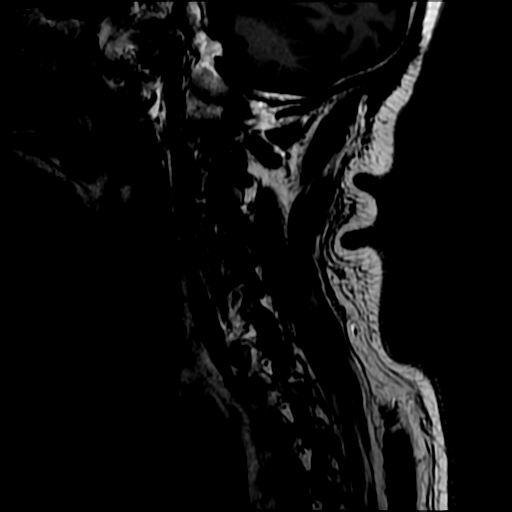
[im 10/15]
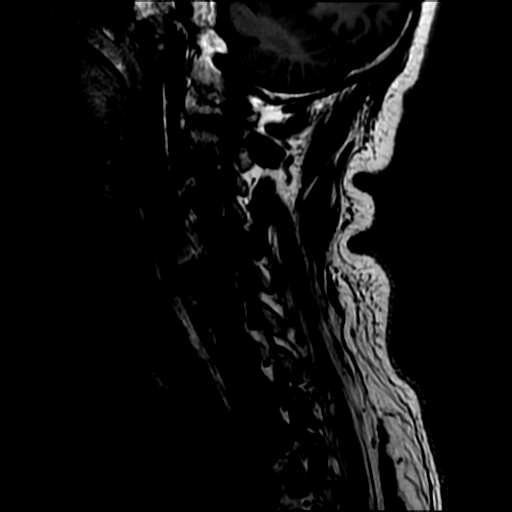
[im 15/15]
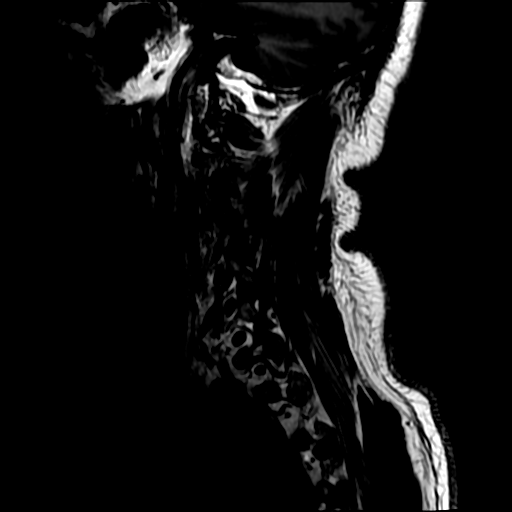

[Series 5: T2 · axial · 3.0mm · 0.35mm/px · z∈[-133,-24]mm · 8 of 40 slices shown (2 of 2)]
[im 1/40]
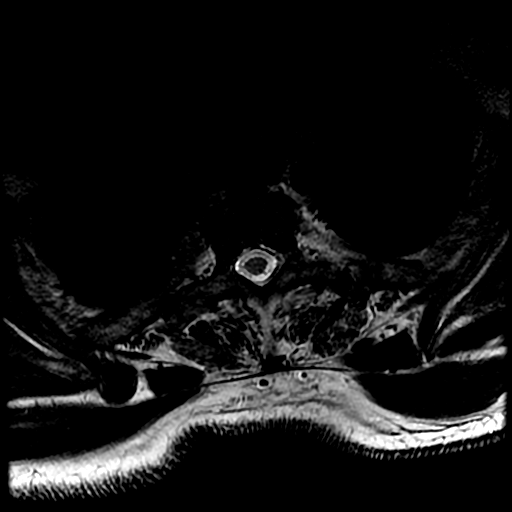
[im 8/40]
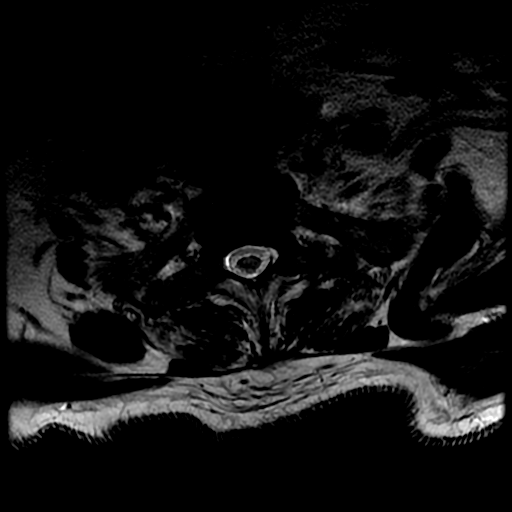
[im 11/40]
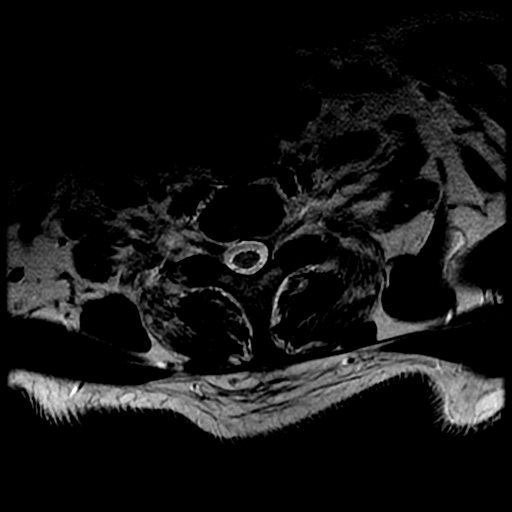
[im 18/40]
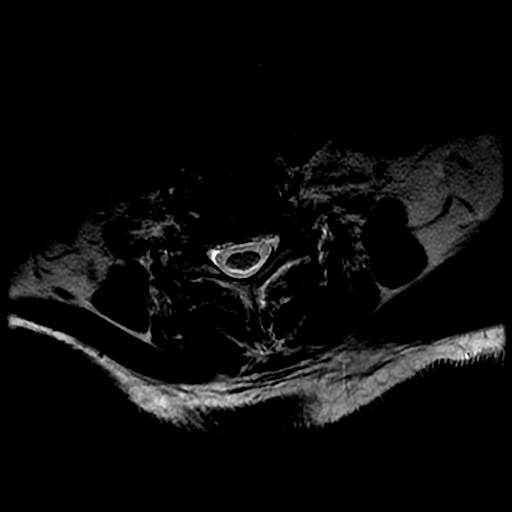
[im 22/40]
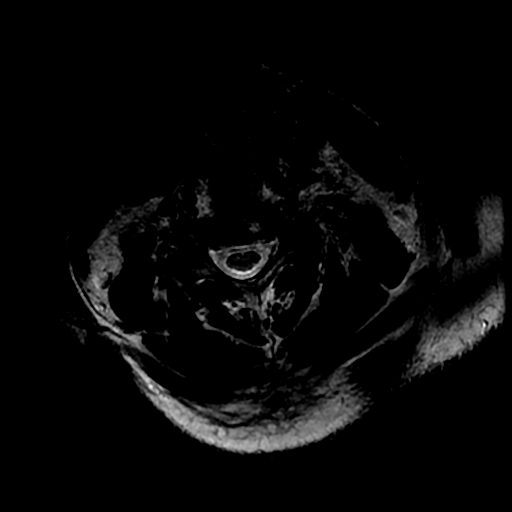
[im 29/40]
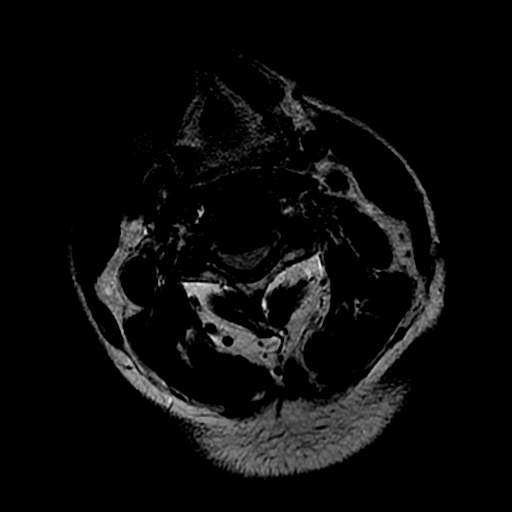
[im 32/40]
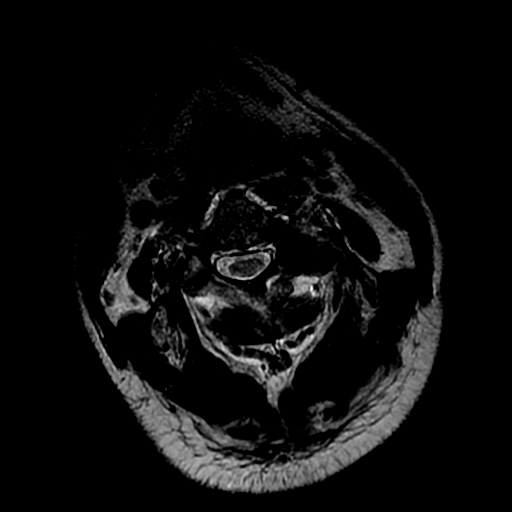
[im 36/40]
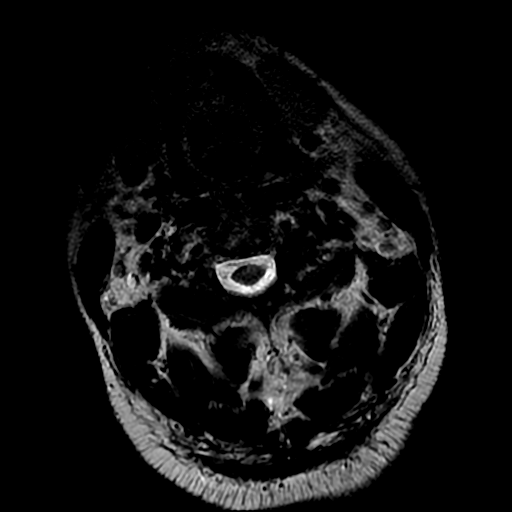

[19 of 48 positions shown; findings below may reference images not displayed]

FINDINGS: Alignment: Straightening without subluxation.

Vertebrae: No evidence of fracture. Heterogeneous marrow also seen
in the lumbar spine in 4763. No focal masslike area. No evidence of
ligamentous injury.

Cord: No cord signal abnormality.

Posterior Fossa, vertebral arteries, paraspinal tissues: No visible
strain.

Disc levels:

C2-3: Spondylosis. Asymmetric left facet spurring. Mild left
foraminal stenosis. Patent spinal canal.

C3-4: Degenerative disc narrowing with posterior disc osteophyte
complex. Ligamentum flavum buckling. These changes cause spinal
stenosis with complete CSF effacement and cord flattening. No
superimposed cord edema is seen. Disc height loss and uncovertebral
spurring cause biforaminal impingement.

C4-5: Spondylosis. Asymmetric left facet spurring. Patent spinal
canal. Mild left foraminal narrowing.

C5-6: Spondylosis and disc narrowing with uncovertebral spurring.
Patent spinal canal. Mild to moderate foraminal narrowing.

C6-7: Spondylosis.  No herniation or impingement

C7-T1:Mild spondylosis.  No impingement.
IMPRESSION: 1. C3-4 degenerative spinal stenosis with cord flattening. No
visible cord contusion.
2. No occult fracture, major ligament tear, or strain.
3. C3-4 bilateral foraminal impingement. C5-6 mild to moderate
bilateral foraminal narrowing.

## 2020-09-12 ENCOUNTER — Other Ambulatory Visit: Payer: Self-pay | Admitting: Internal Medicine

## 2020-09-12 DIAGNOSIS — Z122 Encounter for screening for malignant neoplasm of respiratory organs: Secondary | ICD-10-CM

## 2020-09-22 ENCOUNTER — Other Ambulatory Visit: Payer: Self-pay

## 2020-09-22 ENCOUNTER — Ambulatory Visit
Admission: RE | Admit: 2020-09-22 | Discharge: 2020-09-22 | Disposition: A | Discharge status: Home / Self Care | Payer: 59 | Source: Ambulatory Visit | Attending: Internal Medicine | Admitting: Internal Medicine

## 2020-09-22 DIAGNOSIS — Z122 Encounter for screening for malignant neoplasm of respiratory organs: Secondary | ICD-10-CM
